# Patient Record
Sex: Female | Born: 1965 | Marital: Single
Health system: Southern US, Community
[De-identification: ages and names within clinical notes are randomized; demographics above are authoritative.]

## PROBLEM LIST (undated history)

## (undated) DIAGNOSIS — I639 Cerebral infarction, unspecified: Secondary | ICD-10-CM

## (undated) DIAGNOSIS — E079 Disorder of thyroid, unspecified: Secondary | ICD-10-CM

## (undated) DIAGNOSIS — E119 Type 2 diabetes mellitus without complications: Secondary | ICD-10-CM

## (undated) HISTORY — DX: Type 2 diabetes mellitus without complications: E11.9

## (undated) HISTORY — DX: Disorder of thyroid, unspecified: E07.9

## (undated) HISTORY — DX: Cerebral infarction, unspecified: I63.9

---

## 2006-09-30 ENCOUNTER — Inpatient Hospital Stay: Payer: Self-pay | Admitting: Internal Medicine

## 2006-09-30 ENCOUNTER — Ambulatory Visit: Payer: Self-pay | Admitting: Internal Medicine

## 2006-09-30 ENCOUNTER — Other Ambulatory Visit: Payer: Self-pay

## 2006-10-01 ENCOUNTER — Other Ambulatory Visit: Payer: Self-pay

## 2006-10-02 ENCOUNTER — Other Ambulatory Visit: Payer: Self-pay

## 2007-01-15 ENCOUNTER — Emergency Department: Payer: Self-pay | Admitting: Emergency Medicine

## 2007-02-05 ENCOUNTER — Ambulatory Visit: Payer: Self-pay

## 2007-02-07 ENCOUNTER — Ambulatory Visit: Payer: Self-pay

## 2007-03-25 ENCOUNTER — Ambulatory Visit: Payer: Self-pay

## 2007-03-25 ENCOUNTER — Other Ambulatory Visit: Payer: Self-pay

## 2020-03-24 ENCOUNTER — Emergency Department: Payer: Medicaid Other

## 2020-03-24 ENCOUNTER — Other Ambulatory Visit: Payer: Self-pay

## 2020-03-24 ENCOUNTER — Emergency Department
Admission: EM | Admit: 2020-03-24 | Discharge: 2020-03-24 | Disposition: A | Discharge status: Home / Self Care | Payer: Medicaid Other | Attending: Emergency Medicine | Admitting: Emergency Medicine

## 2020-03-24 DIAGNOSIS — R29810 Facial weakness: Secondary | ICD-10-CM | POA: Insufficient documentation

## 2020-03-24 DIAGNOSIS — R531 Weakness: Secondary | ICD-10-CM | POA: Insufficient documentation

## 2020-03-24 DIAGNOSIS — Z5321 Procedure and treatment not carried out due to patient leaving prior to being seen by health care provider: Secondary | ICD-10-CM | POA: Insufficient documentation

## 2020-03-24 DIAGNOSIS — R4781 Slurred speech: Secondary | ICD-10-CM | POA: Insufficient documentation

## 2020-03-24 LAB — DIFFERENTIAL
Abs Immature Granulocytes: 0.02 10*3/uL (ref 0.00–0.07)
Basophils Absolute: 0 10*3/uL (ref 0.0–0.1)
Basophils Relative: 0 %
Eosinophils Absolute: 0.4 10*3/uL (ref 0.0–0.5)
Eosinophils Relative: 4 %
Immature Granulocytes: 0 %
Lymphocytes Relative: 20 %
Lymphs Abs: 1.7 10*3/uL (ref 0.7–4.0)
Monocytes Absolute: 0.6 10*3/uL (ref 0.1–1.0)
Monocytes Relative: 7 %
Neutro Abs: 5.9 10*3/uL (ref 1.7–7.7)
Neutrophils Relative %: 69 %

## 2020-03-24 LAB — PROTIME-INR
INR: 0.9 (ref 0.8–1.2)
Prothrombin Time: 11.8 seconds (ref 11.4–15.2)

## 2020-03-24 LAB — CBC
HCT: 36.8 % (ref 36.0–46.0)
Hemoglobin: 12.5 g/dL (ref 12.0–15.0)
MCH: 29 pg (ref 26.0–34.0)
MCHC: 34 g/dL (ref 30.0–36.0)
MCV: 85.4 fL (ref 80.0–100.0)
Platelets: 234 10*3/uL (ref 150–400)
RBC: 4.31 MIL/uL (ref 3.87–5.11)
RDW: 12.8 % (ref 11.5–15.5)
WBC: 8.5 10*3/uL (ref 4.0–10.5)
nRBC: 0 % (ref 0.0–0.2)

## 2020-03-24 LAB — COMPREHENSIVE METABOLIC PANEL
ALT: 20 U/L (ref 0–44)
AST: 20 U/L (ref 15–41)
Albumin: 4.3 g/dL (ref 3.5–5.0)
Alkaline Phosphatase: 72 U/L (ref 38–126)
Anion gap: 10 (ref 5–15)
BUN: 26 mg/dL — ABNORMAL HIGH (ref 6–20)
CO2: 27 mmol/L (ref 22–32)
Calcium: 9.4 mg/dL (ref 8.9–10.3)
Chloride: 102 mmol/L (ref 98–111)
Creatinine, Ser: 1.36 mg/dL — ABNORMAL HIGH (ref 0.44–1.00)
GFR, Estimated: 46 mL/min — ABNORMAL LOW (ref >60)
Glucose, Bld: 174 mg/dL — ABNORMAL HIGH (ref 70–99)
Potassium: 3.9 mmol/L (ref 3.5–5.1)
Sodium: 139 mmol/L (ref 135–145)
Total Bilirubin: 0.7 mg/dL (ref 0.3–1.2)
Total Protein: 8.2 g/dL — ABNORMAL HIGH (ref 6.5–8.1)

## 2020-03-24 LAB — APTT: aPTT: 27 seconds (ref 24–36)

## 2020-03-24 NOTE — ED Triage Notes (Signed)
First RN note: pt to ED via POV with c/o L upper extremity weakness, L sided facial numbness, and slurred speech x 1 week. Pt states sent by PCP due to concerns regarding possibly having another stroke.

## 2020-03-24 NOTE — ED Triage Notes (Signed)
See first nurse note, pt to ED sent by PCP to see if having another stroke.  Hx of stroke with left side deficit.  States she has been having taste bud changes and increased weakness to left side x1 week.  Left grip slightly weaker than right.  Face symmetrical.  Slow speech noted, pt reports this is deficit from previous stroke.  Alert and oriented

## 2020-03-25 LAB — TROPONIN I (HIGH SENSITIVITY): Troponin I (High Sensitivity): 12 ng/L (ref <18)

## 2021-09-28 IMAGING — CT CT HEAD W/O CM
3 series · 15 of 47 positions shown, 18 images · non-contrast
Comparison: None.

CLINICAL DATA: Mental status change.

EXAM:
CT HEAD WITHOUT CONTRAST
TECHNIQUE: Contiguous axial images were obtained from the base of the skull
through the vertex without intravenous contrast.

[Series 2: head wo · axial · 0.44mm/px · z∈[+453,+593]mm · 9 of 34 slices shown, 12 images]
[im 3/34  brain]
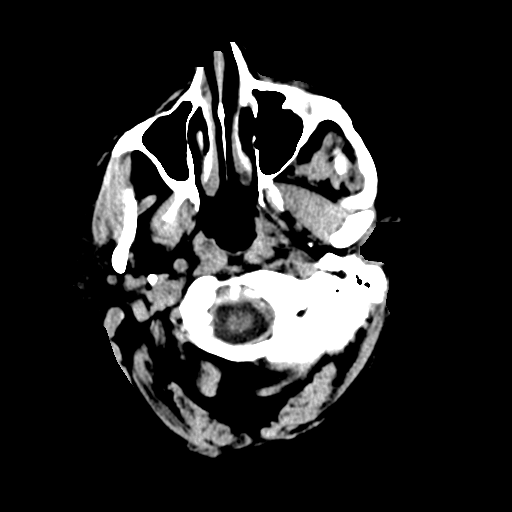
[im 3/34  bone]
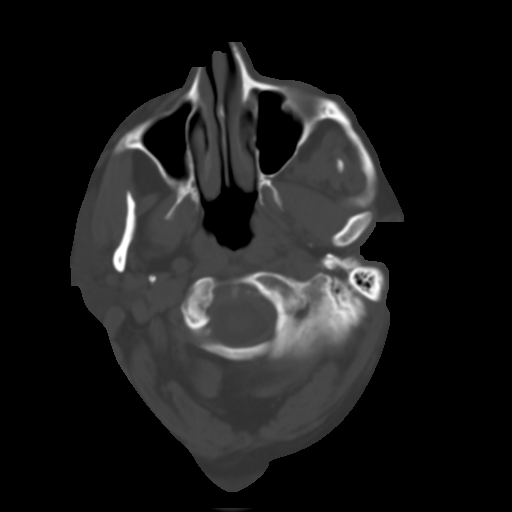
[im 6/34  brain]
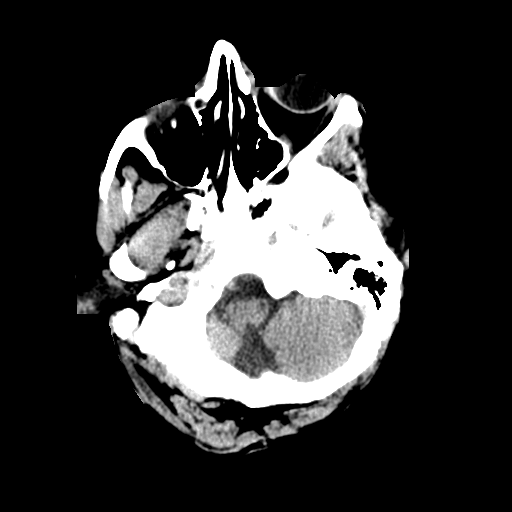
[im 10/34  brain]
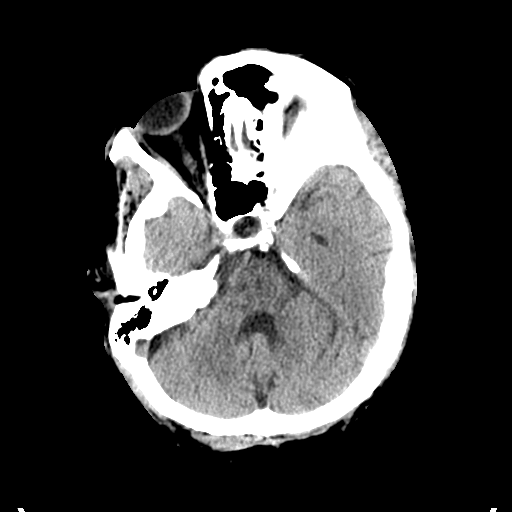
[im 13/34  brain]
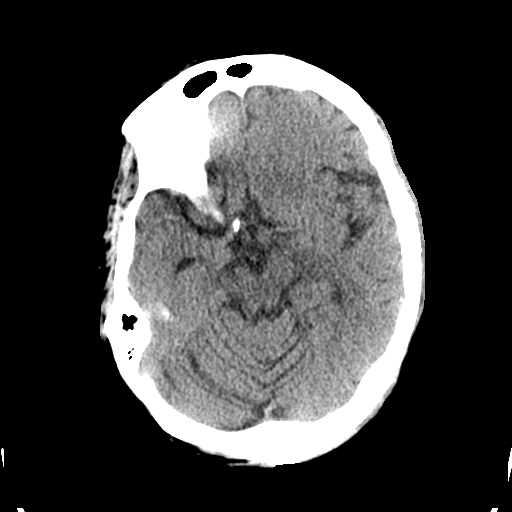
[im 18/34  brain]
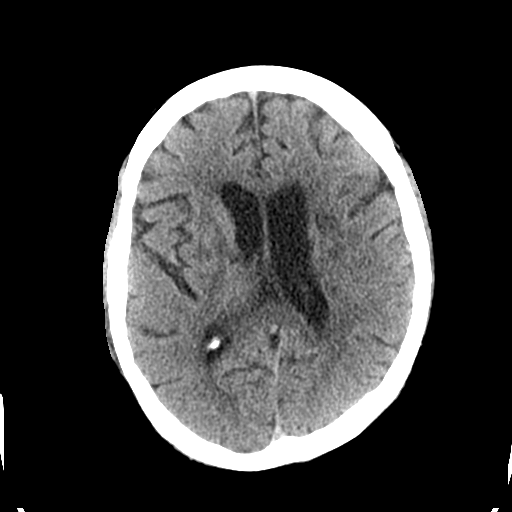
[im 18/34  bone]
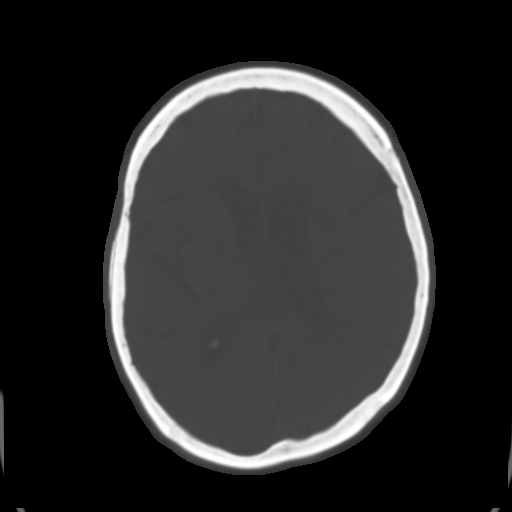
[im 21/34  brain]
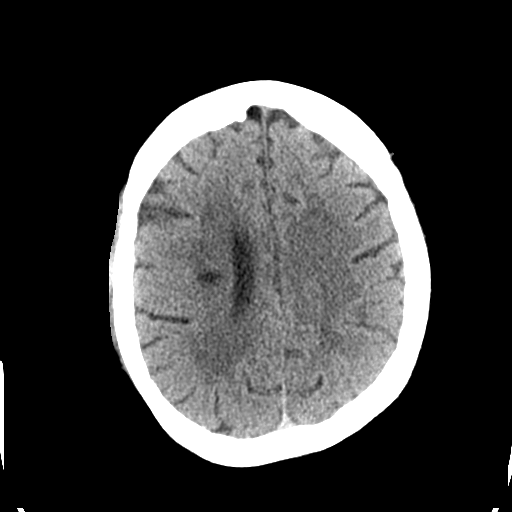
[im 24/34  brain]
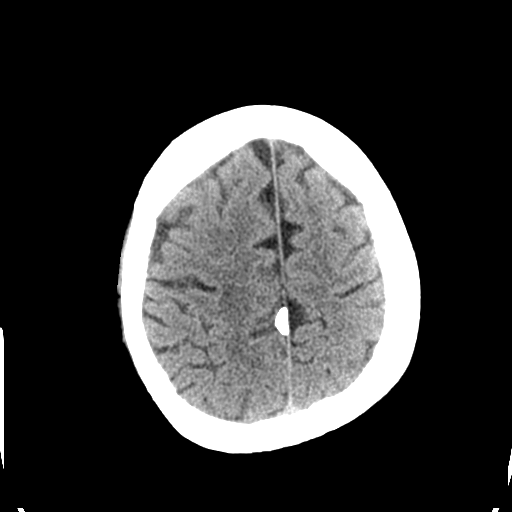
[im 28/34  brain]
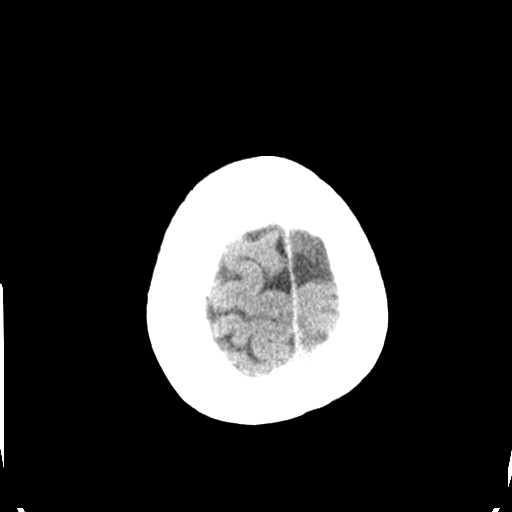
[im 31/34  brain]
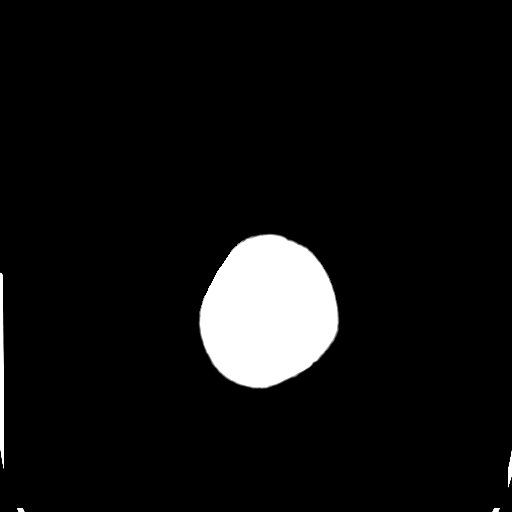
[im 31/34  bone]
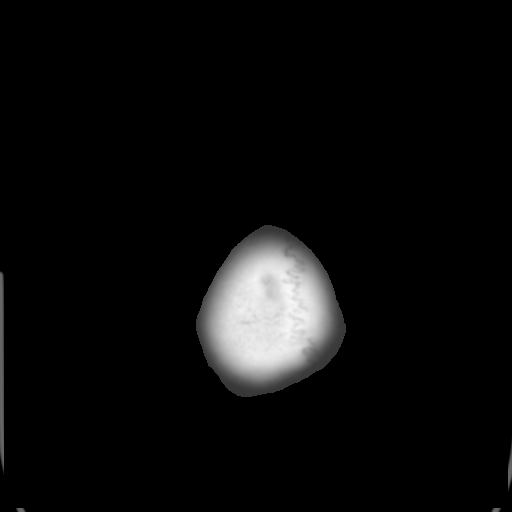

[Series 4: coronal soft tissue · coronal · 0.35mm/px · 3 of 75 slices shown]
[im 25/75  brain]
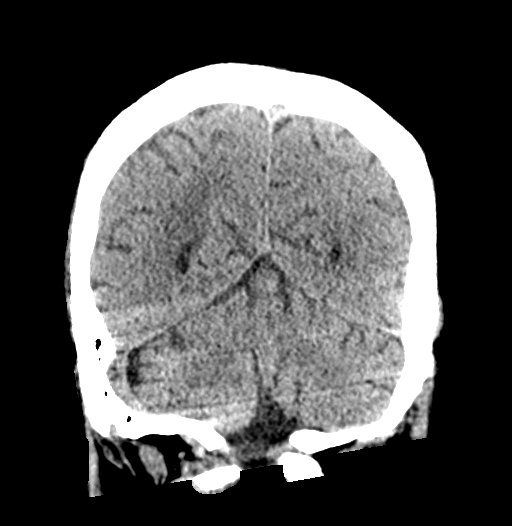
[im 33/75  brain]
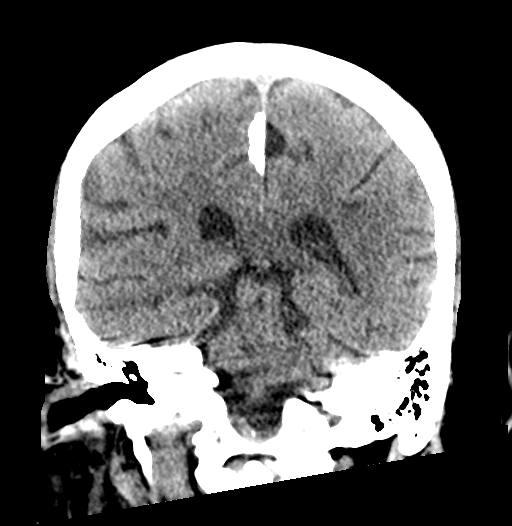
[im 42/75  brain]
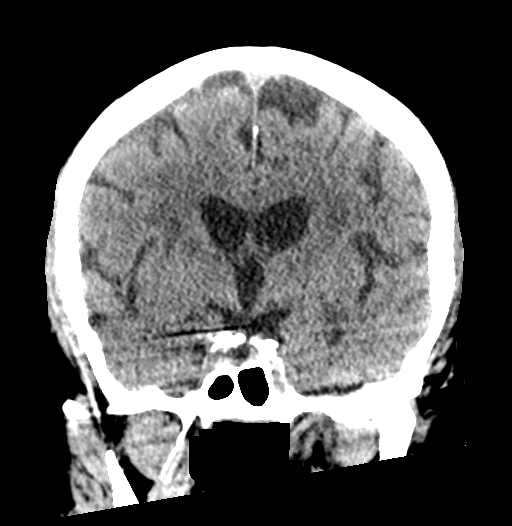

[Series 5: sagittal soft tissue · sagittal · 0.33mm/px · 3 of 68 slices shown]
[im 23/68  brain]
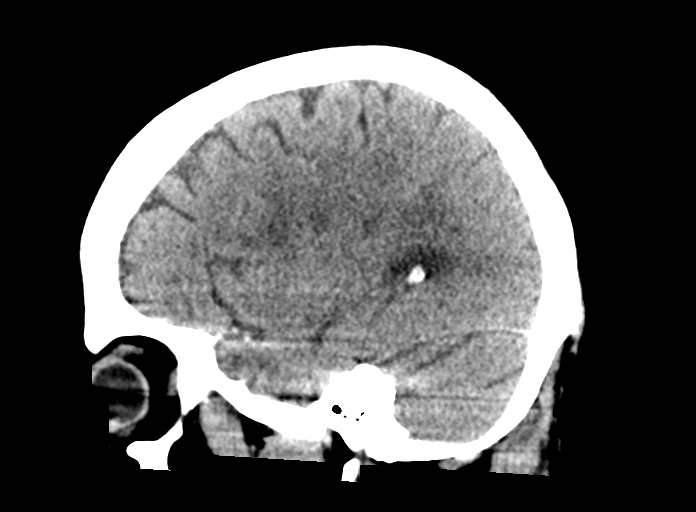
[im 34/68  brain]
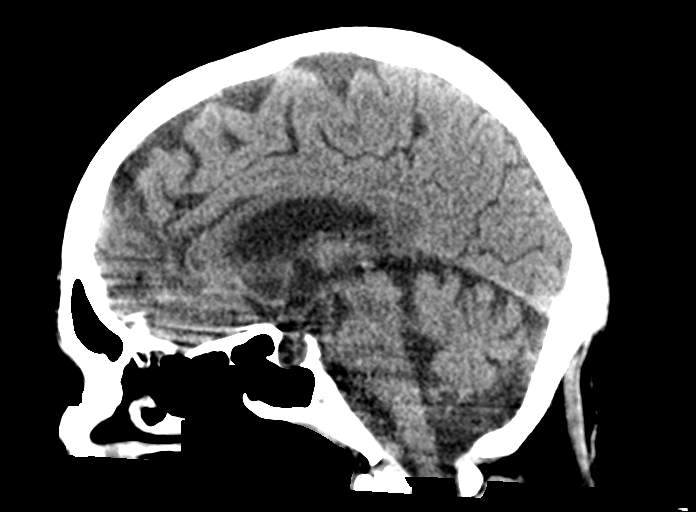
[im 45/68  brain]
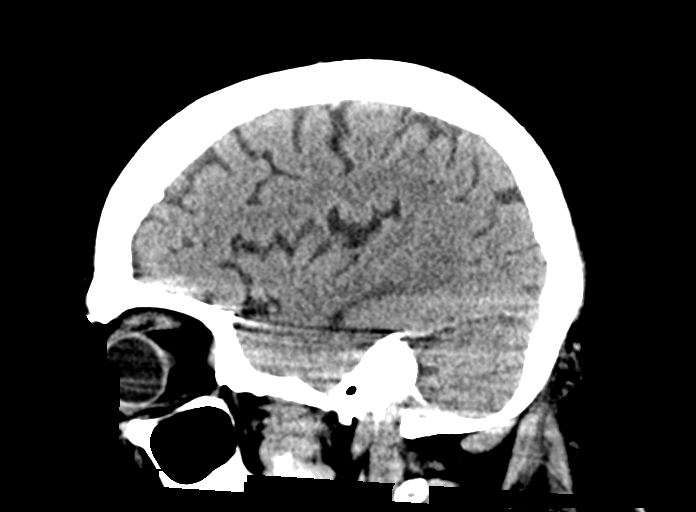

[15 of 47 positions shown; findings below may reference images not displayed]

FINDINGS: Brain: Scattered focal hypodensities within bilateral supratentorial
white matter and the pons. Additional more patchy white matter
hypoattenuation, compatible with chronic microvascular ischemic
disease. No evidence of acute large vascular territory infarct. No
acute hemorrhage. No hydrocephalus. Mild generalized atrophy with ex
vacuo ventricular dilation. No abnormal mass effect. No midline
shift.

Vascular: Calcific atherosclerosis.

Skull: No acute fracture.

Sinuses/Orbits: Sinuses are clear.  Unremarkable orbits.

Other: No mastoid effusions.
IMPRESSION: Scattered hypodensities within the bilateral supratentorial white
matter and the pons. These findings are compatible with age-advanced
chronic microvascular ischemic disease with white matter infarcts
that are age indeterminate in the absence of priors. If there is
concern for acute infarct, recommend MRI to further evaluate.
